# Patient Record
Sex: Female | Born: 1965 | Race: Black or African American | Hispanic: No | Marital: Married | Smoking: Never smoker
Health system: Southern US, Community
[De-identification: ages and names within clinical notes are randomized; demographics above are authoritative.]

## PROBLEM LIST (undated history)

## (undated) DIAGNOSIS — I10 Essential (primary) hypertension: Secondary | ICD-10-CM

## (undated) DIAGNOSIS — G709 Myoneural disorder, unspecified: Secondary | ICD-10-CM

## (undated) DIAGNOSIS — M1990 Unspecified osteoarthritis, unspecified site: Secondary | ICD-10-CM

## (undated) DIAGNOSIS — F419 Anxiety disorder, unspecified: Secondary | ICD-10-CM

## (undated) DIAGNOSIS — K219 Gastro-esophageal reflux disease without esophagitis: Secondary | ICD-10-CM

## (undated) HISTORY — DX: Unspecified osteoarthritis, unspecified site: M19.90

## (undated) HISTORY — DX: Essential (primary) hypertension: I10

## (undated) HISTORY — PX: TONSILECTOMY, ADENOIDECTOMY, BILATERAL MYRINGOTOMY AND TUBES: SHX2538

## (undated) HISTORY — DX: Myoneural disorder, unspecified: G70.9

## (undated) HISTORY — DX: Anxiety disorder, unspecified: F41.9

## (undated) HISTORY — DX: Gastro-esophageal reflux disease without esophagitis: K21.9

---

## 1998-05-25 ENCOUNTER — Other Ambulatory Visit: Admission: RE | Admit: 1998-05-25 | Discharge: 1998-05-25 | Payer: Self-pay | Admitting: Obstetrics and Gynecology

## 1999-05-29 ENCOUNTER — Other Ambulatory Visit: Admission: RE | Admit: 1999-05-29 | Discharge: 1999-05-29 | Payer: Self-pay | Admitting: Obstetrics and Gynecology

## 2000-05-30 ENCOUNTER — Other Ambulatory Visit: Admission: RE | Admit: 2000-05-30 | Discharge: 2000-05-30 | Payer: Self-pay | Admitting: Obstetrics and Gynecology

## 2003-06-28 ENCOUNTER — Encounter: Payer: Self-pay | Admitting: Family Medicine

## 2003-06-28 ENCOUNTER — Encounter: Admission: RE | Admit: 2003-06-28 | Discharge: 2003-06-28 | Payer: Self-pay | Admitting: Family Medicine

## 2004-03-22 ENCOUNTER — Other Ambulatory Visit: Admission: RE | Admit: 2004-03-22 | Discharge: 2004-03-22 | Payer: Self-pay | Admitting: Obstetrics and Gynecology

## 2005-07-11 ENCOUNTER — Other Ambulatory Visit: Admission: RE | Admit: 2005-07-11 | Discharge: 2005-07-11 | Payer: Self-pay | Admitting: Obstetrics and Gynecology

## 2005-08-01 ENCOUNTER — Encounter: Admission: RE | Admit: 2005-08-01 | Discharge: 2005-08-01 | Payer: Self-pay | Admitting: Obstetrics and Gynecology

## 2006-05-21 ENCOUNTER — Encounter: Admission: RE | Admit: 2006-05-21 | Discharge: 2006-05-21 | Payer: Self-pay | Admitting: Family Medicine

## 2006-11-04 ENCOUNTER — Other Ambulatory Visit: Admission: RE | Admit: 2006-11-04 | Discharge: 2006-11-04 | Payer: Self-pay | Admitting: Obstetrics and Gynecology

## 2007-11-18 ENCOUNTER — Other Ambulatory Visit: Admission: RE | Admit: 2007-11-18 | Discharge: 2007-11-18 | Payer: Self-pay | Admitting: Obstetrics and Gynecology

## 2008-12-29 ENCOUNTER — Other Ambulatory Visit: Admission: RE | Admit: 2008-12-29 | Discharge: 2008-12-29 | Payer: Self-pay | Admitting: Obstetrics and Gynecology

## 2009-01-19 ENCOUNTER — Encounter: Admission: RE | Admit: 2009-01-19 | Discharge: 2009-01-19 | Payer: Self-pay | Admitting: Obstetrics and Gynecology

## 2009-12-30 ENCOUNTER — Other Ambulatory Visit: Admission: RE | Admit: 2009-12-30 | Discharge: 2009-12-30 | Payer: Self-pay | Admitting: Obstetrics and Gynecology

## 2011-01-18 ENCOUNTER — Other Ambulatory Visit: Payer: Self-pay | Admitting: Obstetrics and Gynecology

## 2011-01-18 ENCOUNTER — Other Ambulatory Visit (HOSPITAL_COMMUNITY)
Admission: RE | Admit: 2011-01-18 | Discharge: 2011-01-18 | Disposition: A | Discharge status: Home / Self Care | Payer: 59 | Source: Ambulatory Visit | Attending: Obstetrics and Gynecology | Admitting: Obstetrics and Gynecology

## 2011-01-18 DIAGNOSIS — Z01419 Encounter for gynecological examination (general) (routine) without abnormal findings: Secondary | ICD-10-CM | POA: Insufficient documentation

## 2012-02-08 ENCOUNTER — Other Ambulatory Visit: Payer: Self-pay | Admitting: Obstetrics and Gynecology

## 2012-02-08 ENCOUNTER — Other Ambulatory Visit (HOSPITAL_COMMUNITY)
Admission: RE | Admit: 2012-02-08 | Discharge: 2012-02-08 | Disposition: A | Discharge status: Home / Self Care | Payer: BC Managed Care – PPO | Source: Ambulatory Visit | Attending: Obstetrics and Gynecology | Admitting: Obstetrics and Gynecology

## 2012-02-08 DIAGNOSIS — Z01419 Encounter for gynecological examination (general) (routine) without abnormal findings: Secondary | ICD-10-CM | POA: Insufficient documentation

## 2013-04-14 ENCOUNTER — Other Ambulatory Visit (HOSPITAL_COMMUNITY)
Admission: RE | Admit: 2013-04-14 | Discharge: 2013-04-14 | Disposition: A | Discharge status: Home / Self Care | Payer: BC Managed Care – PPO | Source: Ambulatory Visit | Attending: Obstetrics and Gynecology | Admitting: Obstetrics and Gynecology

## 2013-04-14 ENCOUNTER — Other Ambulatory Visit: Payer: Self-pay | Admitting: Obstetrics and Gynecology

## 2013-04-14 DIAGNOSIS — Z01419 Encounter for gynecological examination (general) (routine) without abnormal findings: Secondary | ICD-10-CM | POA: Insufficient documentation

## 2013-04-14 DIAGNOSIS — Z1151 Encounter for screening for human papillomavirus (HPV): Secondary | ICD-10-CM | POA: Insufficient documentation

## 2013-07-26 ENCOUNTER — Ambulatory Visit (INDEPENDENT_AMBULATORY_CARE_PROVIDER_SITE_OTHER): Payer: BC Managed Care – PPO | Admitting: Family Medicine

## 2013-07-26 VITALS — BP 142/90 | HR 83 | Temp 98.4°F | Resp 18 | Ht 68.0 in | Wt 155.6 lb

## 2013-07-26 DIAGNOSIS — J209 Acute bronchitis, unspecified: Secondary | ICD-10-CM

## 2013-07-26 MED ORDER — GUAIFENESIN-CODEINE 100-10 MG/5ML PO SOLN: 5.0000 mL | Freq: Three times a day (TID) | ORAL | Status: AC | PRN

## 2013-07-26 MED ORDER — BENZONATATE 100 MG PO CAPS: 100.0000 mg | ORAL_CAPSULE | Freq: Three times a day (TID) | ORAL | Status: AC | PRN

## 2013-07-26 MED ORDER — ALBUTEROL SULFATE (2.5 MG/3ML) 0.083% IN NEBU
2.5000 mg | INHALATION_SOLUTION | Freq: Once | RESPIRATORY_TRACT | Status: AC
  Administered 2013-07-26: 2.5 mg via RESPIRATORY_TRACT

## 2013-07-26 MED ORDER — AZITHROMYCIN 250 MG PO TABS: ORAL_TABLET | ORAL | Status: AC

## 2013-07-26 MED ORDER — ALBUTEROL SULFATE HFA 108 (90 BASE) MCG/ACT IN AERS: 2.0000 | INHALATION_SPRAY | RESPIRATORY_TRACT | Status: AC | PRN

## 2013-07-26 NOTE — Progress Notes (Signed)
Subjective:    Patient ID: Doris Moore, female    DOB: [birth year], 47 y.o.   MRN: [id number] Chief Complaint  Patient presents with  . Cough    mouth long    HPI  Has had some chest congestion for a month and won't go away on its own. Cough is rattley but not getting anythnig up and causing more pain in back. Cough is constant, no f/c, no sore throat, no sig rhinitis or sinus pressure.  Some SHoB but just slight, no wheeze.  Cough is keeping her up at night.  Has not tried any otc meds.  Has fibromyalgia so has to take stuff to help her sleep normally but not working now.  Past Medical History  Diagnosis Date  . Arthritis   . Anxiety   . GERD (gastroesophageal reflux disease)   . Neuromuscular disorder   . Hypertension    No current outpatient prescriptions on file prior to visit.   No current facility-administered medications on file prior to visit.   No Known Allergies  Review of Systems  Constitutional: Positive for fatigue. Negative for fever, chills, diaphoresis and activity change.  HENT: Negative for congestion, sore throat and rhinorrhea.   Respiratory: Positive for cough and shortness of breath. Negative for chest tightness and wheezing.   Cardiovascular: Negative for chest pain, palpitations and leg swelling.  Musculoskeletal: Negative for back pain and arthralgias.  Hematological: Negative for adenopathy.  Psychiatric/Behavioral: Positive for sleep disturbance.      BP 142/90  Pulse 83  Temp(Src) 98.4 F (36.9 C) (Oral)  Resp 18  Ht 5\' 8"  (1.727 m)  Wt 155 lb 9.6 oz (70.58 kg)  BMI 23.66 kg/m2  SpO2 100%  LMP 07/19/2013 Objective:   Physical Exam  Constitutional: She is oriented to person, place, and time. She appears well-developed and well-nourished. No distress.  HENT:  Head: Normocephalic and atraumatic.  Right Ear: Tympanic membrane, external ear and ear canal normal.  Left Ear: Tympanic membrane, external ear and ear canal normal.  Nose:  Nose normal. No mucosal edema or rhinorrhea.  Mouth/Throat: Uvula is midline, oropharynx is clear and moist and mucous membranes are normal. No oropharyngeal exudate.  Eyes: Conjunctivae are normal. Right eye exhibits no discharge. Left eye exhibits no discharge. No scleral icterus.  Neck: Normal range of motion. Neck supple.  Cardiovascular: Normal rate, regular rhythm, normal heart sounds and intact distal pulses.   Pulmonary/Chest: Effort normal and breath sounds normal.  Lymphadenopathy:    She has no cervical adenopathy.  Neurological: She is alert and oriented to person, place, and time.  Skin: Skin is warm and dry. She is not diaphoretic. No erythema.  Psychiatric: She has a normal mood and affect. Her behavior is normal.     peak flow: 400  Goal: 479 so given albuterol neb treatment x 1 in office, repeat: 400 but pt said felt much easier to breath and less back pain Assessment & Plan:  Acute bronchitis - as has had sxs for >1 mo will treat with zpack in addition to symptomatic care with cough meds and prn albuterol  Meds ordered this encounter  Medications  . drospirenone-ethinyl estradiol (YAZ,GIANVI,LORYNA) 3-0.02 MG tablet    Sig: Take 1 tablet by mouth daily.  . hydrochlorothiazide (HYDRODIURIL) 25 MG tablet    Sig: Take 25 mg by mouth daily.  Marland Kitchen buPROPion (WELLBUTRIN XL) 300 MG 24 hr tablet    Sig: Take 300 mg by mouth daily.  . tizanidine (ZANAFLEX)  2 MG capsule    Sig: Take 2 mg by mouth 3 (three) times daily.  . tapentadol (NUCYNTA) 50 MG TABS tablet    Sig: Take 100 mg by mouth.  Marland Kitchen omeprazole (PRILOSEC) 20 MG capsule    Sig: Take 20 mg by mouth daily.  Marland Kitchen zolpidem (AMBIEN) 10 MG tablet    Sig: Take 10 mg by mouth at bedtime as needed for sleep.  Marland Kitchen QUEtiapine (SEROQUEL) 25 MG tablet    Sig: Take 25 mg by mouth at bedtime.  . ALPRAZolam (XANAX) 1 MG tablet    Sig: Take 1 mg by mouth at bedtime as needed for sleep.  . pramipexole (MIRAPEX) 0.75 MG tablet    Sig: Take  0.75 mg by mouth 3 (three) times daily.  Marland Kitchen azithromycin (ZITHROMAX) 250 MG tablet    Sig: Take 2 tabs PO x 1 dose, then 1 tab PO QD x 4 days    Dispense:  6 tablet    Refill:  0  . benzonatate (TESSALON) 100 MG capsule    Sig: Take 1-2 capsules (100-200 mg total) by mouth 3 (three) times daily as needed for cough.    Dispense:  40 capsule    Refill:  0  . guaiFENesin-codeine 100-10 MG/5ML syrup    Sig: Take 5 mLs by mouth 3 (three) times daily as needed for cough.    Dispense:  120 mL    Refill:  0

## 2013-07-26 NOTE — Patient Instructions (Signed)

## 2016-03-26 DIAGNOSIS — M62830 Muscle spasm of back: Secondary | ICD-10-CM | POA: Diagnosis not present

## 2016-03-26 DIAGNOSIS — M47816 Spondylosis without myelopathy or radiculopathy, lumbar region: Secondary | ICD-10-CM | POA: Diagnosis not present

## 2016-03-26 DIAGNOSIS — G894 Chronic pain syndrome: Secondary | ICD-10-CM | POA: Diagnosis not present

## 2016-03-26 DIAGNOSIS — M47812 Spondylosis without myelopathy or radiculopathy, cervical region: Secondary | ICD-10-CM | POA: Diagnosis not present

## 2016-04-09 DIAGNOSIS — F3342 Major depressive disorder, recurrent, in full remission: Secondary | ICD-10-CM | POA: Diagnosis not present

## 2016-04-09 DIAGNOSIS — F410 Panic disorder [episodic paroxysmal anxiety] without agoraphobia: Secondary | ICD-10-CM | POA: Diagnosis not present

## 2016-04-09 DIAGNOSIS — G4709 Other insomnia: Secondary | ICD-10-CM | POA: Diagnosis not present

## 2016-04-23 DIAGNOSIS — M62830 Muscle spasm of back: Secondary | ICD-10-CM | POA: Diagnosis not present

## 2016-04-23 DIAGNOSIS — G894 Chronic pain syndrome: Secondary | ICD-10-CM | POA: Diagnosis not present

## 2016-04-23 DIAGNOSIS — M47812 Spondylosis without myelopathy or radiculopathy, cervical region: Secondary | ICD-10-CM | POA: Diagnosis not present

## 2016-04-23 DIAGNOSIS — M47816 Spondylosis without myelopathy or radiculopathy, lumbar region: Secondary | ICD-10-CM | POA: Diagnosis not present

## 2016-05-14 DIAGNOSIS — M47816 Spondylosis without myelopathy or radiculopathy, lumbar region: Secondary | ICD-10-CM | POA: Diagnosis not present

## 2016-05-14 DIAGNOSIS — M62830 Muscle spasm of back: Secondary | ICD-10-CM | POA: Diagnosis not present

## 2016-05-14 DIAGNOSIS — M47812 Spondylosis without myelopathy or radiculopathy, cervical region: Secondary | ICD-10-CM | POA: Diagnosis not present

## 2016-05-14 DIAGNOSIS — G894 Chronic pain syndrome: Secondary | ICD-10-CM | POA: Diagnosis not present

## 2016-06-12 DIAGNOSIS — G894 Chronic pain syndrome: Secondary | ICD-10-CM | POA: Diagnosis not present

## 2016-06-12 DIAGNOSIS — M62830 Muscle spasm of back: Secondary | ICD-10-CM | POA: Diagnosis not present

## 2016-06-12 DIAGNOSIS — M47812 Spondylosis without myelopathy or radiculopathy, cervical region: Secondary | ICD-10-CM | POA: Diagnosis not present

## 2016-06-12 DIAGNOSIS — M47816 Spondylosis without myelopathy or radiculopathy, lumbar region: Secondary | ICD-10-CM | POA: Diagnosis not present

## 2016-07-11 DIAGNOSIS — G894 Chronic pain syndrome: Secondary | ICD-10-CM | POA: Diagnosis not present

## 2016-07-11 DIAGNOSIS — M47816 Spondylosis without myelopathy or radiculopathy, lumbar region: Secondary | ICD-10-CM | POA: Diagnosis not present

## 2016-07-11 DIAGNOSIS — M62830 Muscle spasm of back: Secondary | ICD-10-CM | POA: Diagnosis not present

## 2016-07-11 DIAGNOSIS — M47812 Spondylosis without myelopathy or radiculopathy, cervical region: Secondary | ICD-10-CM | POA: Diagnosis not present

## 2016-07-24 DIAGNOSIS — Z79899 Other long term (current) drug therapy: Secondary | ICD-10-CM | POA: Diagnosis not present

## 2016-07-24 DIAGNOSIS — I10 Essential (primary) hypertension: Secondary | ICD-10-CM | POA: Diagnosis not present

## 2016-07-24 DIAGNOSIS — E559 Vitamin D deficiency, unspecified: Secondary | ICD-10-CM | POA: Diagnosis not present

## 2016-08-21 DIAGNOSIS — I10 Essential (primary) hypertension: Secondary | ICD-10-CM | POA: Diagnosis not present

## 2016-08-28 DIAGNOSIS — M47812 Spondylosis without myelopathy or radiculopathy, cervical region: Secondary | ICD-10-CM | POA: Diagnosis not present

## 2016-08-28 DIAGNOSIS — G894 Chronic pain syndrome: Secondary | ICD-10-CM | POA: Diagnosis not present

## 2016-08-28 DIAGNOSIS — M47816 Spondylosis without myelopathy or radiculopathy, lumbar region: Secondary | ICD-10-CM | POA: Diagnosis not present

## 2016-08-28 DIAGNOSIS — M62830 Muscle spasm of back: Secondary | ICD-10-CM | POA: Diagnosis not present

## 2016-09-18 DIAGNOSIS — I10 Essential (primary) hypertension: Secondary | ICD-10-CM | POA: Diagnosis not present

## 2016-10-02 DIAGNOSIS — F900 Attention-deficit hyperactivity disorder, predominantly inattentive type: Secondary | ICD-10-CM | POA: Diagnosis not present

## 2016-10-02 DIAGNOSIS — F3342 Major depressive disorder, recurrent, in full remission: Secondary | ICD-10-CM | POA: Diagnosis not present

## 2016-10-02 DIAGNOSIS — G4709 Other insomnia: Secondary | ICD-10-CM | POA: Diagnosis not present

## 2016-10-09 DIAGNOSIS — M62830 Muscle spasm of back: Secondary | ICD-10-CM | POA: Diagnosis not present

## 2016-10-09 DIAGNOSIS — M47812 Spondylosis without myelopathy or radiculopathy, cervical region: Secondary | ICD-10-CM | POA: Diagnosis not present

## 2016-10-09 DIAGNOSIS — G894 Chronic pain syndrome: Secondary | ICD-10-CM | POA: Diagnosis not present

## 2016-10-09 DIAGNOSIS — M47816 Spondylosis without myelopathy or radiculopathy, lumbar region: Secondary | ICD-10-CM | POA: Diagnosis not present

## 2016-11-06 ENCOUNTER — Other Ambulatory Visit: Payer: Self-pay | Admitting: Physical Medicine and Rehabilitation

## 2016-11-06 ENCOUNTER — Ambulatory Visit
Admission: RE | Admit: 2016-11-06 | Discharge: 2016-11-06 | Disposition: A | Discharge status: Home / Self Care | Payer: BLUE CROSS/BLUE SHIELD | Source: Ambulatory Visit | Attending: Physical Medicine and Rehabilitation | Admitting: Physical Medicine and Rehabilitation

## 2016-11-06 DIAGNOSIS — M25561 Pain in right knee: Secondary | ICD-10-CM

## 2016-11-06 DIAGNOSIS — G894 Chronic pain syndrome: Secondary | ICD-10-CM | POA: Diagnosis not present

## 2016-11-06 DIAGNOSIS — M25462 Effusion, left knee: Secondary | ICD-10-CM | POA: Diagnosis not present

## 2016-11-06 DIAGNOSIS — M47812 Spondylosis without myelopathy or radiculopathy, cervical region: Secondary | ICD-10-CM | POA: Diagnosis not present

## 2016-11-06 DIAGNOSIS — M25562 Pain in left knee: Principal | ICD-10-CM

## 2016-11-06 DIAGNOSIS — M47816 Spondylosis without myelopathy or radiculopathy, lumbar region: Secondary | ICD-10-CM | POA: Diagnosis not present

## 2016-11-06 DIAGNOSIS — M25461 Effusion, right knee: Secondary | ICD-10-CM | POA: Diagnosis not present

## 2016-11-06 DIAGNOSIS — M62830 Muscle spasm of back: Secondary | ICD-10-CM | POA: Diagnosis not present

## 2016-12-04 DIAGNOSIS — M47816 Spondylosis without myelopathy or radiculopathy, lumbar region: Secondary | ICD-10-CM | POA: Diagnosis not present

## 2016-12-04 DIAGNOSIS — G894 Chronic pain syndrome: Secondary | ICD-10-CM | POA: Diagnosis not present

## 2016-12-04 DIAGNOSIS — M47812 Spondylosis without myelopathy or radiculopathy, cervical region: Secondary | ICD-10-CM | POA: Diagnosis not present

## 2016-12-04 DIAGNOSIS — M62830 Muscle spasm of back: Secondary | ICD-10-CM | POA: Diagnosis not present

## 2017-01-01 DIAGNOSIS — G894 Chronic pain syndrome: Secondary | ICD-10-CM | POA: Diagnosis not present

## 2017-01-01 DIAGNOSIS — M47816 Spondylosis without myelopathy or radiculopathy, lumbar region: Secondary | ICD-10-CM | POA: Diagnosis not present

## 2017-01-01 DIAGNOSIS — M47812 Spondylosis without myelopathy or radiculopathy, cervical region: Secondary | ICD-10-CM | POA: Diagnosis not present

## 2017-01-01 DIAGNOSIS — M62830 Muscle spasm of back: Secondary | ICD-10-CM | POA: Diagnosis not present

## 2017-01-29 DIAGNOSIS — M62830 Muscle spasm of back: Secondary | ICD-10-CM | POA: Diagnosis not present

## 2017-01-29 DIAGNOSIS — G894 Chronic pain syndrome: Secondary | ICD-10-CM | POA: Diagnosis not present

## 2017-01-29 DIAGNOSIS — M47812 Spondylosis without myelopathy or radiculopathy, cervical region: Secondary | ICD-10-CM | POA: Diagnosis not present

## 2017-01-29 DIAGNOSIS — M47816 Spondylosis without myelopathy or radiculopathy, lumbar region: Secondary | ICD-10-CM | POA: Diagnosis not present

## 2017-02-26 DIAGNOSIS — G894 Chronic pain syndrome: Secondary | ICD-10-CM | POA: Diagnosis not present

## 2017-02-26 DIAGNOSIS — M47812 Spondylosis without myelopathy or radiculopathy, cervical region: Secondary | ICD-10-CM | POA: Diagnosis not present

## 2017-02-26 DIAGNOSIS — Z79891 Long term (current) use of opiate analgesic: Secondary | ICD-10-CM | POA: Diagnosis not present

## 2017-02-26 DIAGNOSIS — M62830 Muscle spasm of back: Secondary | ICD-10-CM | POA: Diagnosis not present

## 2017-02-26 DIAGNOSIS — M47816 Spondylosis without myelopathy or radiculopathy, lumbar region: Secondary | ICD-10-CM | POA: Diagnosis not present

## 2017-03-05 DIAGNOSIS — G5621 Lesion of ulnar nerve, right upper limb: Secondary | ICD-10-CM | POA: Diagnosis not present

## 2017-03-05 DIAGNOSIS — M5412 Radiculopathy, cervical region: Secondary | ICD-10-CM | POA: Diagnosis not present

## 2017-03-26 DIAGNOSIS — M47812 Spondylosis without myelopathy or radiculopathy, cervical region: Secondary | ICD-10-CM | POA: Diagnosis not present

## 2017-03-26 DIAGNOSIS — M62830 Muscle spasm of back: Secondary | ICD-10-CM | POA: Diagnosis not present

## 2017-03-26 DIAGNOSIS — G894 Chronic pain syndrome: Secondary | ICD-10-CM | POA: Diagnosis not present

## 2017-03-26 DIAGNOSIS — M47816 Spondylosis without myelopathy or radiculopathy, lumbar region: Secondary | ICD-10-CM | POA: Diagnosis not present

## 2017-04-17 DIAGNOSIS — F900 Attention-deficit hyperactivity disorder, predominantly inattentive type: Secondary | ICD-10-CM | POA: Diagnosis not present

## 2017-04-17 DIAGNOSIS — G4709 Other insomnia: Secondary | ICD-10-CM | POA: Diagnosis not present

## 2017-04-17 DIAGNOSIS — F411 Generalized anxiety disorder: Secondary | ICD-10-CM | POA: Diagnosis not present

## 2017-04-23 DIAGNOSIS — G894 Chronic pain syndrome: Secondary | ICD-10-CM | POA: Diagnosis not present

## 2017-04-23 DIAGNOSIS — M62830 Muscle spasm of back: Secondary | ICD-10-CM | POA: Diagnosis not present

## 2017-04-23 DIAGNOSIS — M47816 Spondylosis without myelopathy or radiculopathy, lumbar region: Secondary | ICD-10-CM | POA: Diagnosis not present

## 2017-04-23 DIAGNOSIS — M47812 Spondylosis without myelopathy or radiculopathy, cervical region: Secondary | ICD-10-CM | POA: Diagnosis not present

## 2017-06-18 DIAGNOSIS — M62830 Muscle spasm of back: Secondary | ICD-10-CM | POA: Diagnosis not present

## 2017-06-18 DIAGNOSIS — M47816 Spondylosis without myelopathy or radiculopathy, lumbar region: Secondary | ICD-10-CM | POA: Diagnosis not present

## 2017-06-18 DIAGNOSIS — M47812 Spondylosis without myelopathy or radiculopathy, cervical region: Secondary | ICD-10-CM | POA: Diagnosis not present

## 2017-06-18 DIAGNOSIS — G894 Chronic pain syndrome: Secondary | ICD-10-CM | POA: Diagnosis not present

## 2017-06-29 IMAGING — CR DG KNEE COMPLETE 4+V*L*
4 series · 4 of 4 positions shown · non-contrast
Comparison: None in PACs

CLINICAL DATA: Bilateral knee pain and inflammatory sensation
increasing in severity over the past 3-4 months. Difficulty
extending the knees.

EXAM:
RIGHT KNEE - COMPLETE 4+ VIEW; LEFT KNEE - COMPLETE 4+ VIEW

[w knee ap left]
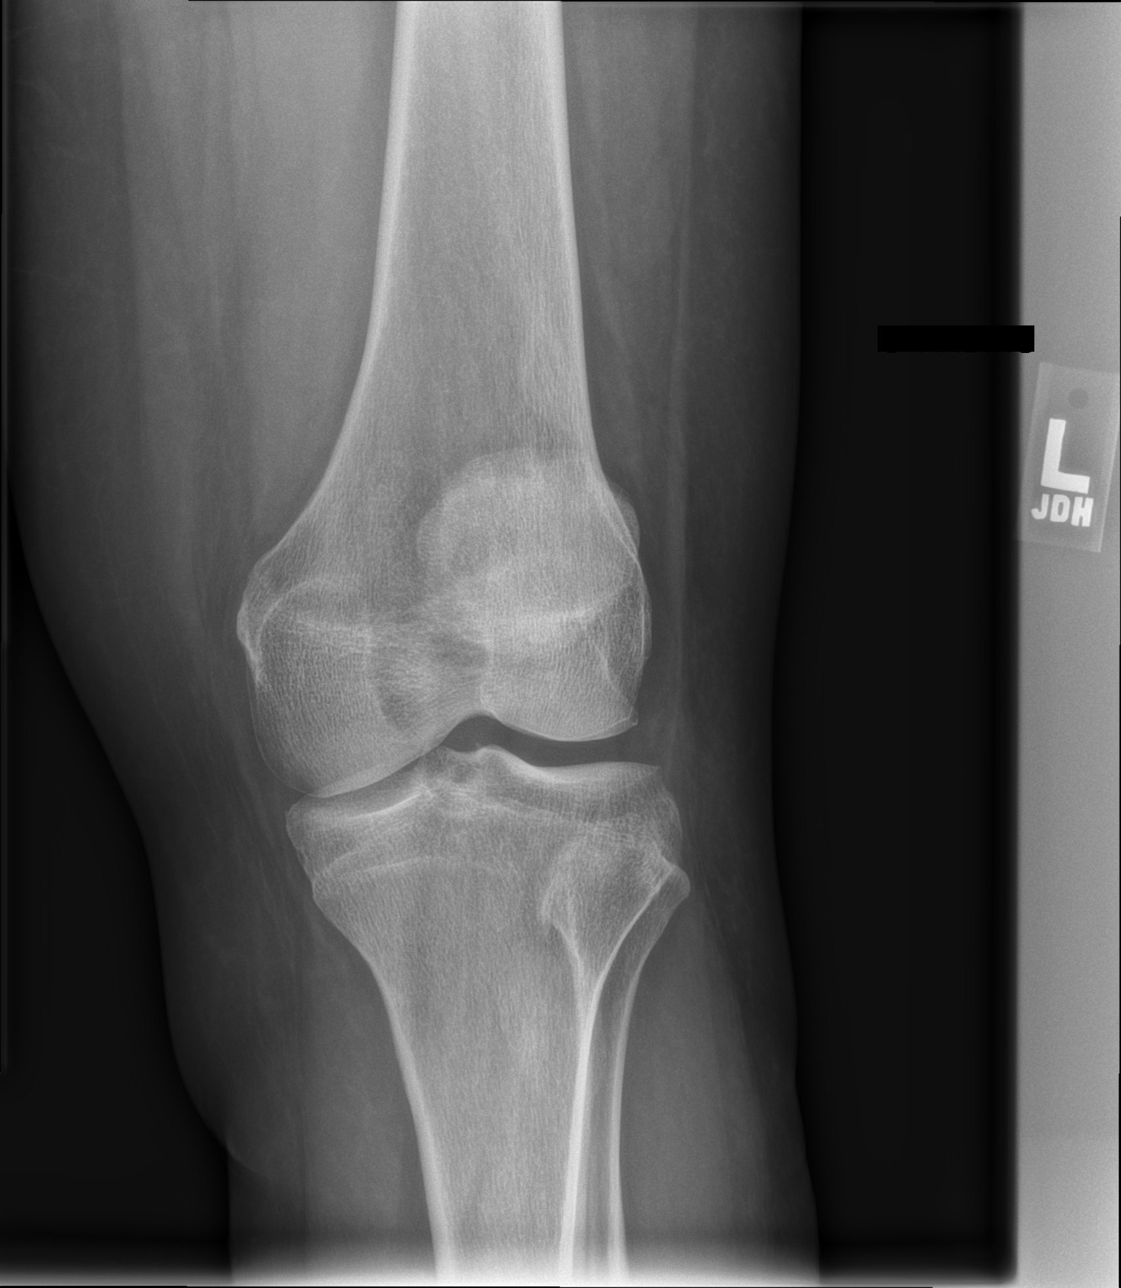

[w knee lat left]
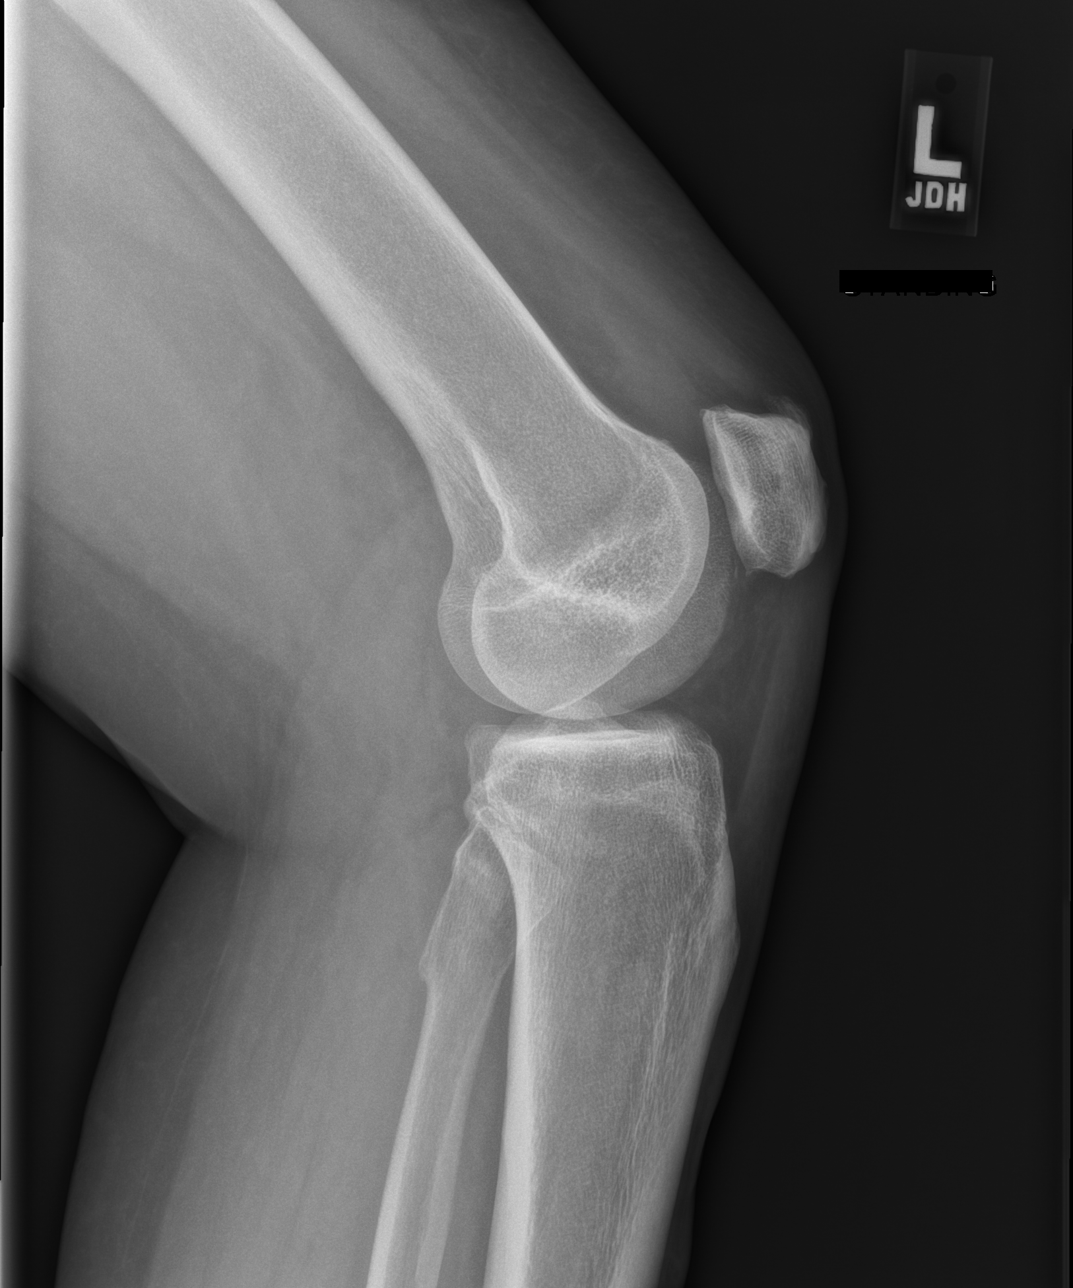

[x knee sunrise left]
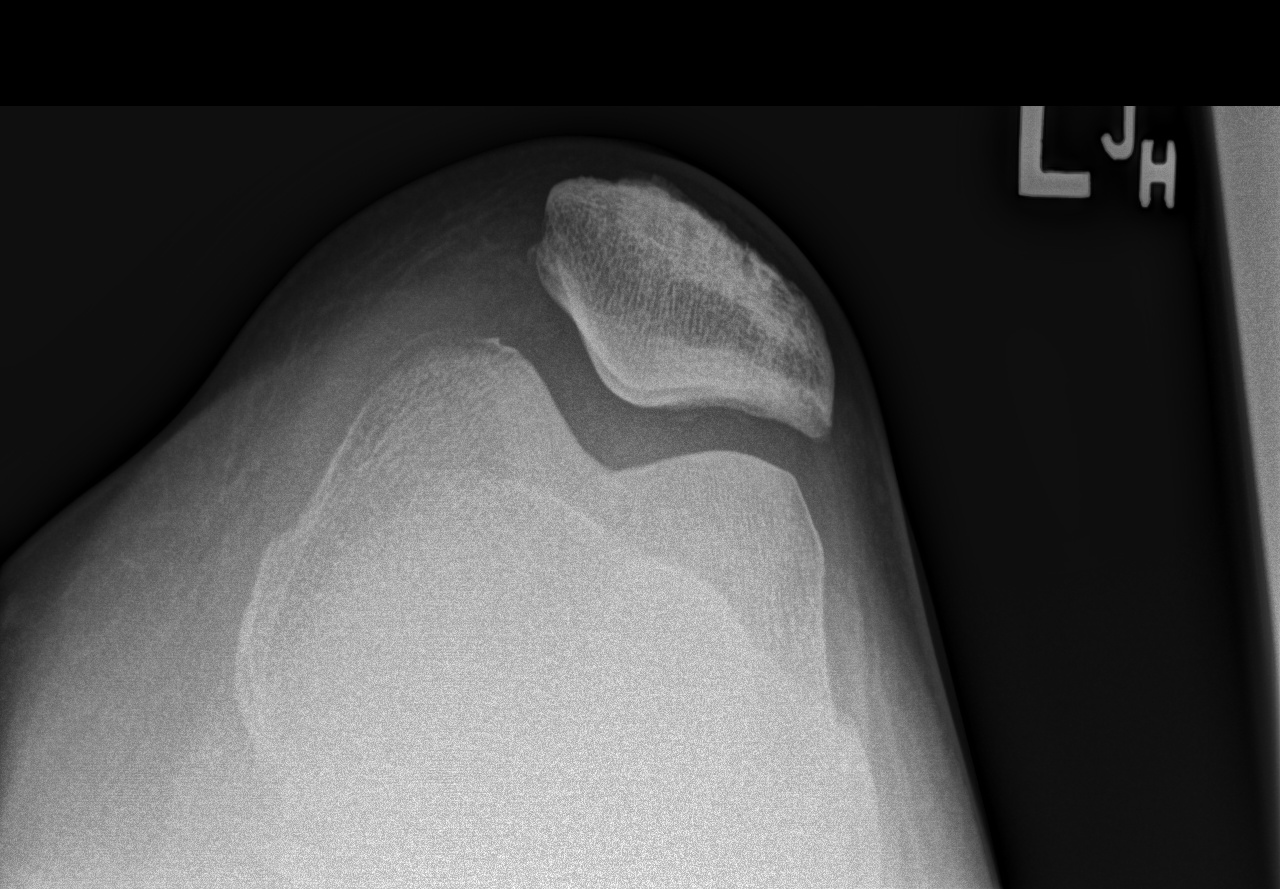

[x knee tunnel left]
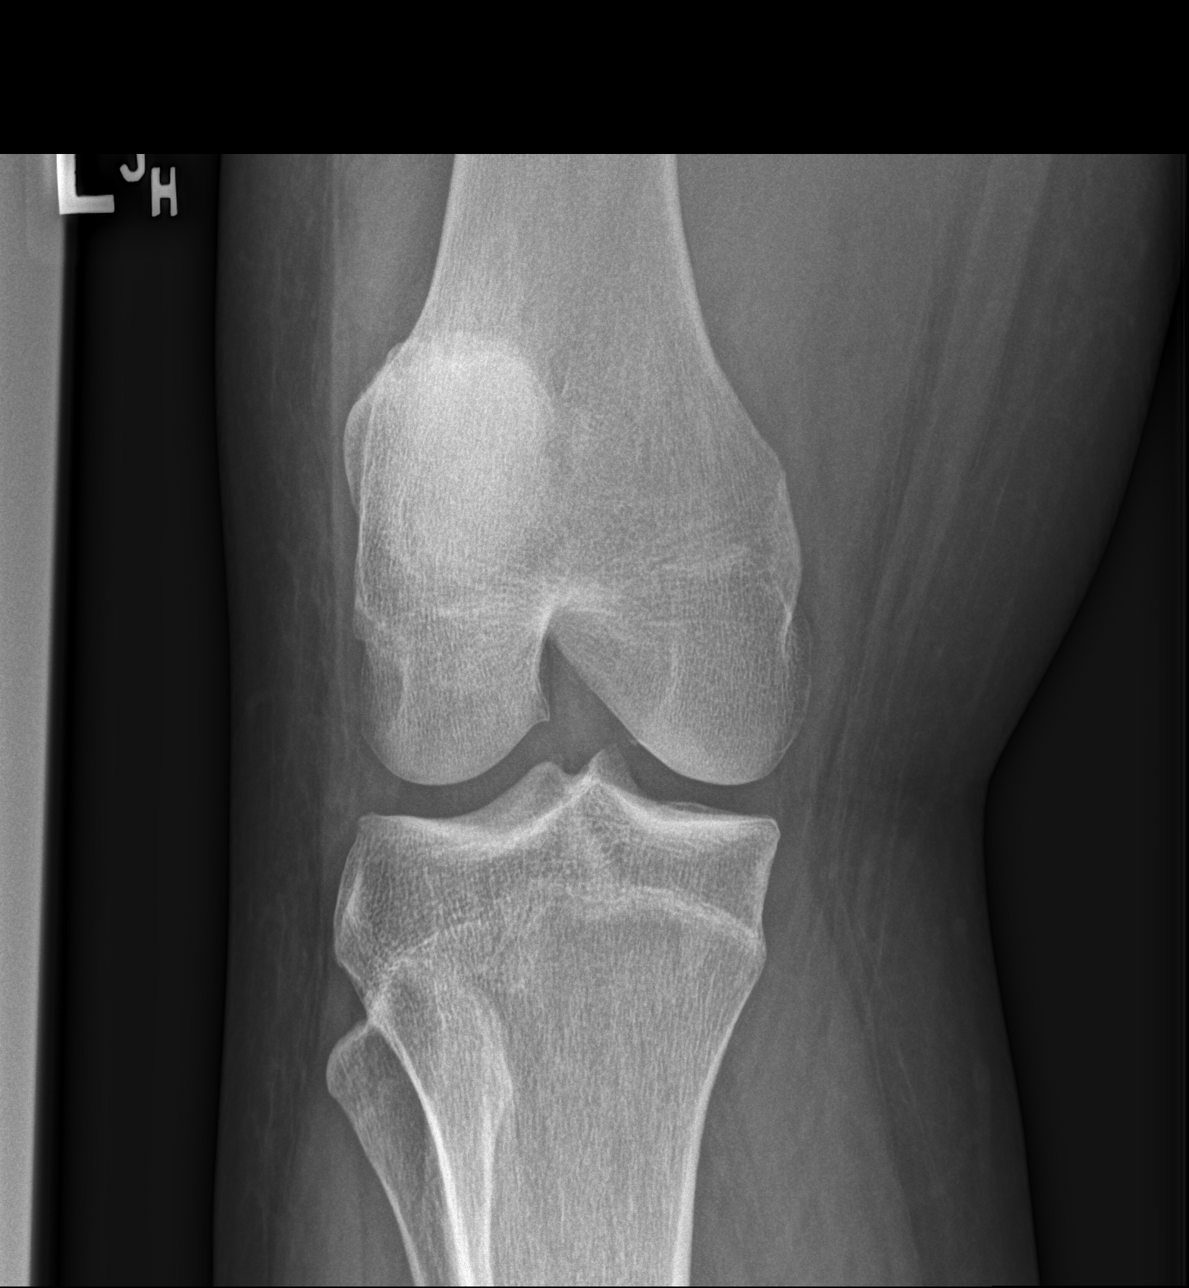

[4 of 4 positions shown; findings below may reference images not displayed]

FINDINGS: Right knee: The bones are subjectively adequately mineralized. There
is no lytic or blastic lesion or acute or healing fracture. The
joint spaces are preserved. There is minimal beaking of the tibial
spines. A tiny spur arises from the inferior articular margin of the
patella. A small suprapatellar effusion is suspected.

Left knee: The bones are subjectively adequately mineralized. The
joint spaces are well maintained. There is mild beaking of the
tibial spines. A small osteophyte arises from the medial aspect of
the lateral femoral condyle. There is a small spur arising from the
inferior articular margin of the patella. There is a small
suprapatellar effusion. There is no acute fracture or dislocation.
IMPRESSION: Mild osteoarthritic changes of both knees. No significant joint
space loss. No acute bony abnormality. Small suprapatellar effusions
bilaterally.

## 2017-07-16 DIAGNOSIS — M47812 Spondylosis without myelopathy or radiculopathy, cervical region: Secondary | ICD-10-CM | POA: Diagnosis not present

## 2017-07-16 DIAGNOSIS — M47816 Spondylosis without myelopathy or radiculopathy, lumbar region: Secondary | ICD-10-CM | POA: Diagnosis not present

## 2017-07-16 DIAGNOSIS — M62830 Muscle spasm of back: Secondary | ICD-10-CM | POA: Diagnosis not present

## 2017-07-16 DIAGNOSIS — G894 Chronic pain syndrome: Secondary | ICD-10-CM | POA: Diagnosis not present

## 2017-07-30 DIAGNOSIS — I10 Essential (primary) hypertension: Secondary | ICD-10-CM | POA: Diagnosis not present

## 2017-07-30 DIAGNOSIS — Z79899 Other long term (current) drug therapy: Secondary | ICD-10-CM | POA: Diagnosis not present

## 2017-07-30 DIAGNOSIS — Z1211 Encounter for screening for malignant neoplasm of colon: Secondary | ICD-10-CM | POA: Diagnosis not present

## 2017-07-30 DIAGNOSIS — E559 Vitamin D deficiency, unspecified: Secondary | ICD-10-CM | POA: Diagnosis not present

## 2017-08-20 DIAGNOSIS — M47812 Spondylosis without myelopathy or radiculopathy, cervical region: Secondary | ICD-10-CM | POA: Diagnosis not present

## 2017-08-20 DIAGNOSIS — M62830 Muscle spasm of back: Secondary | ICD-10-CM | POA: Diagnosis not present

## 2017-08-20 DIAGNOSIS — G894 Chronic pain syndrome: Secondary | ICD-10-CM | POA: Diagnosis not present

## 2017-08-20 DIAGNOSIS — M47816 Spondylosis without myelopathy or radiculopathy, lumbar region: Secondary | ICD-10-CM | POA: Diagnosis not present

## 2017-09-17 DIAGNOSIS — M62830 Muscle spasm of back: Secondary | ICD-10-CM | POA: Diagnosis not present

## 2017-09-17 DIAGNOSIS — G894 Chronic pain syndrome: Secondary | ICD-10-CM | POA: Diagnosis not present

## 2017-09-17 DIAGNOSIS — M47812 Spondylosis without myelopathy or radiculopathy, cervical region: Secondary | ICD-10-CM | POA: Diagnosis not present

## 2017-09-17 DIAGNOSIS — M47816 Spondylosis without myelopathy or radiculopathy, lumbar region: Secondary | ICD-10-CM | POA: Diagnosis not present

## 2017-10-15 DIAGNOSIS — G894 Chronic pain syndrome: Secondary | ICD-10-CM | POA: Diagnosis not present

## 2017-10-15 DIAGNOSIS — M47816 Spondylosis without myelopathy or radiculopathy, lumbar region: Secondary | ICD-10-CM | POA: Diagnosis not present

## 2017-10-15 DIAGNOSIS — M47812 Spondylosis without myelopathy or radiculopathy, cervical region: Secondary | ICD-10-CM | POA: Diagnosis not present

## 2017-10-15 DIAGNOSIS — M62830 Muscle spasm of back: Secondary | ICD-10-CM | POA: Diagnosis not present

## 2017-11-19 DIAGNOSIS — F900 Attention-deficit hyperactivity disorder, predominantly inattentive type: Secondary | ICD-10-CM | POA: Diagnosis not present

## 2017-11-19 DIAGNOSIS — F3342 Major depressive disorder, recurrent, in full remission: Secondary | ICD-10-CM | POA: Diagnosis not present

## 2017-11-19 DIAGNOSIS — F411 Generalized anxiety disorder: Secondary | ICD-10-CM | POA: Diagnosis not present

## 2017-12-11 DIAGNOSIS — M47812 Spondylosis without myelopathy or radiculopathy, cervical region: Secondary | ICD-10-CM | POA: Diagnosis not present

## 2017-12-11 DIAGNOSIS — M47816 Spondylosis without myelopathy or radiculopathy, lumbar region: Secondary | ICD-10-CM | POA: Diagnosis not present

## 2017-12-11 DIAGNOSIS — G894 Chronic pain syndrome: Secondary | ICD-10-CM | POA: Diagnosis not present

## 2017-12-11 DIAGNOSIS — M62830 Muscle spasm of back: Secondary | ICD-10-CM | POA: Diagnosis not present

## 2018-01-14 DIAGNOSIS — M62830 Muscle spasm of back: Secondary | ICD-10-CM | POA: Diagnosis not present

## 2018-01-14 DIAGNOSIS — M47816 Spondylosis without myelopathy or radiculopathy, lumbar region: Secondary | ICD-10-CM | POA: Diagnosis not present

## 2018-01-14 DIAGNOSIS — G894 Chronic pain syndrome: Secondary | ICD-10-CM | POA: Diagnosis not present

## 2018-01-14 DIAGNOSIS — M47812 Spondylosis without myelopathy or radiculopathy, cervical region: Secondary | ICD-10-CM | POA: Diagnosis not present

## 2018-02-11 DIAGNOSIS — G894 Chronic pain syndrome: Secondary | ICD-10-CM | POA: Diagnosis not present

## 2018-02-11 DIAGNOSIS — M47812 Spondylosis without myelopathy or radiculopathy, cervical region: Secondary | ICD-10-CM | POA: Diagnosis not present

## 2018-02-11 DIAGNOSIS — M62830 Muscle spasm of back: Secondary | ICD-10-CM | POA: Diagnosis not present

## 2018-02-11 DIAGNOSIS — Z79891 Long term (current) use of opiate analgesic: Secondary | ICD-10-CM | POA: Diagnosis not present

## 2018-02-11 DIAGNOSIS — M47816 Spondylosis without myelopathy or radiculopathy, lumbar region: Secondary | ICD-10-CM | POA: Diagnosis not present

## 2018-03-18 DIAGNOSIS — M47816 Spondylosis without myelopathy or radiculopathy, lumbar region: Secondary | ICD-10-CM | POA: Diagnosis not present

## 2018-03-18 DIAGNOSIS — G894 Chronic pain syndrome: Secondary | ICD-10-CM | POA: Diagnosis not present

## 2018-03-18 DIAGNOSIS — M62830 Muscle spasm of back: Secondary | ICD-10-CM | POA: Diagnosis not present

## 2018-03-18 DIAGNOSIS — M47812 Spondylosis without myelopathy or radiculopathy, cervical region: Secondary | ICD-10-CM | POA: Diagnosis not present

## 2018-04-15 DIAGNOSIS — M62830 Muscle spasm of back: Secondary | ICD-10-CM | POA: Diagnosis not present

## 2018-04-15 DIAGNOSIS — M47812 Spondylosis without myelopathy or radiculopathy, cervical region: Secondary | ICD-10-CM | POA: Diagnosis not present

## 2018-04-15 DIAGNOSIS — G894 Chronic pain syndrome: Secondary | ICD-10-CM | POA: Diagnosis not present

## 2018-04-15 DIAGNOSIS — M47816 Spondylosis without myelopathy or radiculopathy, lumbar region: Secondary | ICD-10-CM | POA: Diagnosis not present

## 2018-05-13 DIAGNOSIS — F3342 Major depressive disorder, recurrent, in full remission: Secondary | ICD-10-CM | POA: Diagnosis not present

## 2018-05-13 DIAGNOSIS — F900 Attention-deficit hyperactivity disorder, predominantly inattentive type: Secondary | ICD-10-CM | POA: Diagnosis not present

## 2018-05-13 DIAGNOSIS — M47816 Spondylosis without myelopathy or radiculopathy, lumbar region: Secondary | ICD-10-CM | POA: Diagnosis not present

## 2018-05-13 DIAGNOSIS — G894 Chronic pain syndrome: Secondary | ICD-10-CM | POA: Diagnosis not present

## 2018-05-13 DIAGNOSIS — M47812 Spondylosis without myelopathy or radiculopathy, cervical region: Secondary | ICD-10-CM | POA: Diagnosis not present

## 2018-05-13 DIAGNOSIS — M62830 Muscle spasm of back: Secondary | ICD-10-CM | POA: Diagnosis not present

## 2018-05-13 DIAGNOSIS — F411 Generalized anxiety disorder: Secondary | ICD-10-CM | POA: Diagnosis not present

## 2018-06-13 DIAGNOSIS — M47816 Spondylosis without myelopathy or radiculopathy, lumbar region: Secondary | ICD-10-CM | POA: Diagnosis not present

## 2018-06-13 DIAGNOSIS — M47812 Spondylosis without myelopathy or radiculopathy, cervical region: Secondary | ICD-10-CM | POA: Diagnosis not present

## 2018-06-13 DIAGNOSIS — G894 Chronic pain syndrome: Secondary | ICD-10-CM | POA: Diagnosis not present

## 2018-06-13 DIAGNOSIS — M62830 Muscle spasm of back: Secondary | ICD-10-CM | POA: Diagnosis not present

## 2018-07-15 DIAGNOSIS — M47816 Spondylosis without myelopathy or radiculopathy, lumbar region: Secondary | ICD-10-CM | POA: Diagnosis not present

## 2018-07-15 DIAGNOSIS — G894 Chronic pain syndrome: Secondary | ICD-10-CM | POA: Diagnosis not present

## 2018-07-15 DIAGNOSIS — M47812 Spondylosis without myelopathy or radiculopathy, cervical region: Secondary | ICD-10-CM | POA: Diagnosis not present

## 2018-07-15 DIAGNOSIS — M62830 Muscle spasm of back: Secondary | ICD-10-CM | POA: Diagnosis not present

## 2018-08-13 DIAGNOSIS — M47816 Spondylosis without myelopathy or radiculopathy, lumbar region: Secondary | ICD-10-CM | POA: Diagnosis not present

## 2018-08-13 DIAGNOSIS — G894 Chronic pain syndrome: Secondary | ICD-10-CM | POA: Diagnosis not present

## 2018-08-13 DIAGNOSIS — M62830 Muscle spasm of back: Secondary | ICD-10-CM | POA: Diagnosis not present

## 2018-08-13 DIAGNOSIS — M47812 Spondylosis without myelopathy or radiculopathy, cervical region: Secondary | ICD-10-CM | POA: Diagnosis not present

## 2018-09-15 DIAGNOSIS — M47812 Spondylosis without myelopathy or radiculopathy, cervical region: Secondary | ICD-10-CM | POA: Diagnosis not present

## 2018-09-15 DIAGNOSIS — M47816 Spondylosis without myelopathy or radiculopathy, lumbar region: Secondary | ICD-10-CM | POA: Diagnosis not present

## 2018-09-15 DIAGNOSIS — M62830 Muscle spasm of back: Secondary | ICD-10-CM | POA: Diagnosis not present

## 2018-09-15 DIAGNOSIS — G894 Chronic pain syndrome: Secondary | ICD-10-CM | POA: Diagnosis not present

## 2018-10-14 DIAGNOSIS — M47812 Spondylosis without myelopathy or radiculopathy, cervical region: Secondary | ICD-10-CM | POA: Diagnosis not present

## 2018-10-14 DIAGNOSIS — M62830 Muscle spasm of back: Secondary | ICD-10-CM | POA: Diagnosis not present

## 2018-10-14 DIAGNOSIS — M47816 Spondylosis without myelopathy or radiculopathy, lumbar region: Secondary | ICD-10-CM | POA: Diagnosis not present

## 2018-10-14 DIAGNOSIS — G894 Chronic pain syndrome: Secondary | ICD-10-CM | POA: Diagnosis not present

## 2018-11-04 DIAGNOSIS — F3342 Major depressive disorder, recurrent, in full remission: Secondary | ICD-10-CM | POA: Diagnosis not present

## 2018-11-12 DIAGNOSIS — M47816 Spondylosis without myelopathy or radiculopathy, lumbar region: Secondary | ICD-10-CM | POA: Diagnosis not present

## 2018-11-12 DIAGNOSIS — M47812 Spondylosis without myelopathy or radiculopathy, cervical region: Secondary | ICD-10-CM | POA: Diagnosis not present

## 2018-11-12 DIAGNOSIS — M62830 Muscle spasm of back: Secondary | ICD-10-CM | POA: Diagnosis not present

## 2018-11-12 DIAGNOSIS — G894 Chronic pain syndrome: Secondary | ICD-10-CM | POA: Diagnosis not present

## 2018-12-16 DIAGNOSIS — G894 Chronic pain syndrome: Secondary | ICD-10-CM | POA: Diagnosis not present

## 2018-12-16 DIAGNOSIS — M47816 Spondylosis without myelopathy or radiculopathy, lumbar region: Secondary | ICD-10-CM | POA: Diagnosis not present

## 2018-12-16 DIAGNOSIS — M62830 Muscle spasm of back: Secondary | ICD-10-CM | POA: Diagnosis not present

## 2018-12-16 DIAGNOSIS — M47812 Spondylosis without myelopathy or radiculopathy, cervical region: Secondary | ICD-10-CM | POA: Diagnosis not present

## 2019-01-13 DIAGNOSIS — Z1211 Encounter for screening for malignant neoplasm of colon: Secondary | ICD-10-CM | POA: Diagnosis not present

## 2019-01-13 DIAGNOSIS — M47816 Spondylosis without myelopathy or radiculopathy, lumbar region: Secondary | ICD-10-CM | POA: Diagnosis not present

## 2019-01-13 DIAGNOSIS — Z79899 Other long term (current) drug therapy: Secondary | ICD-10-CM | POA: Diagnosis not present

## 2019-01-13 DIAGNOSIS — I10 Essential (primary) hypertension: Secondary | ICD-10-CM | POA: Diagnosis not present

## 2019-01-13 DIAGNOSIS — M47812 Spondylosis without myelopathy or radiculopathy, cervical region: Secondary | ICD-10-CM | POA: Diagnosis not present

## 2019-01-13 DIAGNOSIS — M62830 Muscle spasm of back: Secondary | ICD-10-CM | POA: Diagnosis not present

## 2019-01-13 DIAGNOSIS — E559 Vitamin D deficiency, unspecified: Secondary | ICD-10-CM | POA: Diagnosis not present

## 2019-01-13 DIAGNOSIS — G894 Chronic pain syndrome: Secondary | ICD-10-CM | POA: Diagnosis not present

## 2019-01-13 DIAGNOSIS — Z79891 Long term (current) use of opiate analgesic: Secondary | ICD-10-CM | POA: Diagnosis not present

## 2019-02-11 DIAGNOSIS — M62830 Muscle spasm of back: Secondary | ICD-10-CM | POA: Diagnosis not present

## 2019-02-11 DIAGNOSIS — M47812 Spondylosis without myelopathy or radiculopathy, cervical region: Secondary | ICD-10-CM | POA: Diagnosis not present

## 2019-02-11 DIAGNOSIS — M47816 Spondylosis without myelopathy or radiculopathy, lumbar region: Secondary | ICD-10-CM | POA: Diagnosis not present

## 2019-02-11 DIAGNOSIS — G894 Chronic pain syndrome: Secondary | ICD-10-CM | POA: Diagnosis not present

## 2019-03-17 DIAGNOSIS — M47812 Spondylosis without myelopathy or radiculopathy, cervical region: Secondary | ICD-10-CM | POA: Diagnosis not present

## 2019-03-17 DIAGNOSIS — G894 Chronic pain syndrome: Secondary | ICD-10-CM | POA: Diagnosis not present

## 2019-03-17 DIAGNOSIS — M62830 Muscle spasm of back: Secondary | ICD-10-CM | POA: Diagnosis not present

## 2019-03-17 DIAGNOSIS — M47816 Spondylosis without myelopathy or radiculopathy, lumbar region: Secondary | ICD-10-CM | POA: Diagnosis not present

## 2019-04-14 DIAGNOSIS — M47812 Spondylosis without myelopathy or radiculopathy, cervical region: Secondary | ICD-10-CM | POA: Diagnosis not present

## 2019-04-14 DIAGNOSIS — M62830 Muscle spasm of back: Secondary | ICD-10-CM | POA: Diagnosis not present

## 2019-04-14 DIAGNOSIS — M47816 Spondylosis without myelopathy or radiculopathy, lumbar region: Secondary | ICD-10-CM | POA: Diagnosis not present

## 2019-04-14 DIAGNOSIS — G894 Chronic pain syndrome: Secondary | ICD-10-CM | POA: Diagnosis not present

## 2019-05-16 DIAGNOSIS — F411 Generalized anxiety disorder: Secondary | ICD-10-CM | POA: Diagnosis not present

## 2019-05-16 DIAGNOSIS — F900 Attention-deficit hyperactivity disorder, predominantly inattentive type: Secondary | ICD-10-CM | POA: Diagnosis not present

## 2019-05-18 DIAGNOSIS — M47812 Spondylosis without myelopathy or radiculopathy, cervical region: Secondary | ICD-10-CM | POA: Diagnosis not present

## 2019-05-18 DIAGNOSIS — M47816 Spondylosis without myelopathy or radiculopathy, lumbar region: Secondary | ICD-10-CM | POA: Diagnosis not present

## 2019-05-18 DIAGNOSIS — M62830 Muscle spasm of back: Secondary | ICD-10-CM | POA: Diagnosis not present

## 2019-05-18 DIAGNOSIS — G894 Chronic pain syndrome: Secondary | ICD-10-CM | POA: Diagnosis not present

## 2019-06-16 DIAGNOSIS — M47812 Spondylosis without myelopathy or radiculopathy, cervical region: Secondary | ICD-10-CM | POA: Diagnosis not present

## 2019-06-16 DIAGNOSIS — M62830 Muscle spasm of back: Secondary | ICD-10-CM | POA: Diagnosis not present

## 2019-06-16 DIAGNOSIS — G894 Chronic pain syndrome: Secondary | ICD-10-CM | POA: Diagnosis not present

## 2019-06-16 DIAGNOSIS — M47816 Spondylosis without myelopathy or radiculopathy, lumbar region: Secondary | ICD-10-CM | POA: Diagnosis not present

## 2019-07-21 DIAGNOSIS — G894 Chronic pain syndrome: Secondary | ICD-10-CM | POA: Diagnosis not present

## 2019-07-21 DIAGNOSIS — M62830 Muscle spasm of back: Secondary | ICD-10-CM | POA: Diagnosis not present

## 2019-07-21 DIAGNOSIS — M47816 Spondylosis without myelopathy or radiculopathy, lumbar region: Secondary | ICD-10-CM | POA: Diagnosis not present

## 2019-07-21 DIAGNOSIS — M47812 Spondylosis without myelopathy or radiculopathy, cervical region: Secondary | ICD-10-CM | POA: Diagnosis not present

## 2019-08-18 DIAGNOSIS — M62830 Muscle spasm of back: Secondary | ICD-10-CM | POA: Diagnosis not present

## 2019-08-18 DIAGNOSIS — G894 Chronic pain syndrome: Secondary | ICD-10-CM | POA: Diagnosis not present

## 2019-08-18 DIAGNOSIS — M47812 Spondylosis without myelopathy or radiculopathy, cervical region: Secondary | ICD-10-CM | POA: Diagnosis not present

## 2019-08-18 DIAGNOSIS — M47816 Spondylosis without myelopathy or radiculopathy, lumbar region: Secondary | ICD-10-CM | POA: Diagnosis not present

## 2019-09-16 DIAGNOSIS — G894 Chronic pain syndrome: Secondary | ICD-10-CM | POA: Diagnosis not present

## 2019-09-16 DIAGNOSIS — M47812 Spondylosis without myelopathy or radiculopathy, cervical region: Secondary | ICD-10-CM | POA: Diagnosis not present

## 2019-09-16 DIAGNOSIS — M62830 Muscle spasm of back: Secondary | ICD-10-CM | POA: Diagnosis not present

## 2019-09-16 DIAGNOSIS — M47816 Spondylosis without myelopathy or radiculopathy, lumbar region: Secondary | ICD-10-CM | POA: Diagnosis not present

## 2019-10-20 DIAGNOSIS — G894 Chronic pain syndrome: Secondary | ICD-10-CM | POA: Diagnosis not present

## 2019-10-20 DIAGNOSIS — M47816 Spondylosis without myelopathy or radiculopathy, lumbar region: Secondary | ICD-10-CM | POA: Diagnosis not present

## 2019-10-20 DIAGNOSIS — M47812 Spondylosis without myelopathy or radiculopathy, cervical region: Secondary | ICD-10-CM | POA: Diagnosis not present

## 2019-10-20 DIAGNOSIS — M62830 Muscle spasm of back: Secondary | ICD-10-CM | POA: Diagnosis not present

## 2019-11-17 DIAGNOSIS — M62830 Muscle spasm of back: Secondary | ICD-10-CM | POA: Diagnosis not present

## 2019-11-17 DIAGNOSIS — M47812 Spondylosis without myelopathy or radiculopathy, cervical region: Secondary | ICD-10-CM | POA: Diagnosis not present

## 2019-11-17 DIAGNOSIS — M47816 Spondylosis without myelopathy or radiculopathy, lumbar region: Secondary | ICD-10-CM | POA: Diagnosis not present

## 2019-11-17 DIAGNOSIS — G894 Chronic pain syndrome: Secondary | ICD-10-CM | POA: Diagnosis not present

## 2020-02-08 ENCOUNTER — Ambulatory Visit: Payer: Self-pay

## 2020-02-15 ENCOUNTER — Ambulatory Visit: Payer: Self-pay | Attending: Internal Medicine

## 2020-02-15 DIAGNOSIS — Z23 Encounter for immunization: Secondary | ICD-10-CM

## 2020-02-15 NOTE — Progress Notes (Signed)
   Covid-19 Vaccination Clinic  Name:  Doris Moore    MRN: [id number] DOB: July 04, 1966  02/15/2020  [name] was observed post Covid-19 immunization for 15 minutes without incident. She was provided with Vaccine Information Sheet and instruction to access the V-Safe system.   [name] was instructed to call 911 with any severe reactions post vaccine: Marland Kitchen Difficulty breathing  . Swelling of face and throat  . A fast heartbeat  . A bad rash all over body  . Dizziness and weakness   Immunizations Administered    Name Date Dose VIS Date Route   Pfizer COVID-19 Vaccine 02/15/2020  3:50 PM 0.3 mL 11/13/2019 Intramuscular   Manufacturer: [employer], [employer]   Lot: [id number]   NDC: [address]-7

## 2020-03-08 ENCOUNTER — Ambulatory Visit: Payer: Self-pay

## 2020-03-08 ENCOUNTER — Ambulatory Visit: Payer: Self-pay | Attending: Internal Medicine

## 2020-03-08 DIAGNOSIS — Z23 Encounter for immunization: Secondary | ICD-10-CM

## 2020-03-08 NOTE — Progress Notes (Signed)
   Covid-19 Vaccination Clinic  Name:  Doris Moore    MRN: [id number] DOB: [birth year]  03/08/2020  [name] was observed post Covid-19 immunization for 15 minutes without incident. She was provided with Vaccine Information Sheet and instruction to access the V-Safe system.   [name] was instructed to call 911 with any severe reactions post vaccine: Marland Kitchen Difficulty breathing  . Swelling of face and throat  . A fast heartbeat  . A bad rash all over body  . Dizziness and weakness   Immunizations Administered    Name Date Dose VIS Date Route   Pfizer COVID-19 Vaccine 03/08/2020  5:09 PM 0.3 mL 11/13/2019 Intramuscular   Manufacturer: [employer], [employer]   Lot: [id number]   NDC: [address]-5

## 2022-06-13 ENCOUNTER — Other Ambulatory Visit: Payer: Self-pay | Admitting: Family Medicine

## 2022-06-13 ENCOUNTER — Other Ambulatory Visit (HOSPITAL_COMMUNITY)
Admission: RE | Admit: 2022-06-13 | Discharge: 2022-06-13 | Disposition: A | Discharge status: Home / Self Care | Payer: Commercial Managed Care - HMO | Source: Ambulatory Visit | Attending: Family Medicine | Admitting: Family Medicine

## 2022-06-13 DIAGNOSIS — Z01411 Encounter for gynecological examination (general) (routine) with abnormal findings: Secondary | ICD-10-CM | POA: Insufficient documentation

## 2022-06-15 LAB — CYTOLOGY - PAP
Comment: NEGATIVE
Diagnosis: NEGATIVE
High risk HPV: NEGATIVE

## 2024-03-12 DIAGNOSIS — Z5320 Procedure and treatment not carried out because of patient's decision for unspecified reasons: Secondary | ICD-10-CM | POA: Diagnosis not present

## 2024-03-12 DIAGNOSIS — N958 Other specified menopausal and perimenopausal disorders: Secondary | ICD-10-CM | POA: Diagnosis not present

## 2024-04-24 DIAGNOSIS — I10 Essential (primary) hypertension: Secondary | ICD-10-CM | POA: Diagnosis not present

## 2024-04-24 DIAGNOSIS — E559 Vitamin D deficiency, unspecified: Secondary | ICD-10-CM | POA: Diagnosis not present

## 2024-06-18 DIAGNOSIS — F5101 Primary insomnia: Secondary | ICD-10-CM | POA: Diagnosis not present

## 2024-06-18 DIAGNOSIS — F900 Attention-deficit hyperactivity disorder, predominantly inattentive type: Secondary | ICD-10-CM | POA: Diagnosis not present

## 2024-06-18 DIAGNOSIS — F3342 Major depressive disorder, recurrent, in full remission: Secondary | ICD-10-CM | POA: Diagnosis not present
